# Patient Record
Sex: Male | Born: 1995 | Hispanic: Yes | Marital: Married | Smoking: Never smoker
Health system: Southern US, Community
[De-identification: ages and names within clinical notes are randomized; demographics above are authoritative.]

## PROBLEM LIST (undated history)

## (undated) DIAGNOSIS — J45909 Unspecified asthma, uncomplicated: Secondary | ICD-10-CM

## (undated) HISTORY — DX: Unspecified asthma, uncomplicated: J45.909

---

## 2014-07-07 ENCOUNTER — Emergency Department (HOSPITAL_COMMUNITY)
Admission: EM | Admit: 2014-07-07 | Discharge: 2014-07-07 | Disposition: A | Discharge status: Home / Self Care | Payer: BC Managed Care – PPO | Attending: Emergency Medicine | Admitting: Emergency Medicine

## 2014-07-07 ENCOUNTER — Emergency Department (HOSPITAL_COMMUNITY): Payer: BC Managed Care – PPO

## 2014-07-07 ENCOUNTER — Encounter (HOSPITAL_COMMUNITY): Payer: Self-pay | Admitting: Emergency Medicine

## 2014-07-07 DIAGNOSIS — S6980XA Other specified injuries of unspecified wrist, hand and finger(s), initial encounter: Secondary | ICD-10-CM | POA: Insufficient documentation

## 2014-07-07 DIAGNOSIS — Y9389 Activity, other specified: Secondary | ICD-10-CM | POA: Diagnosis not present

## 2014-07-07 DIAGNOSIS — S6000XA Contusion of unspecified finger without damage to nail, initial encounter: Secondary | ICD-10-CM | POA: Diagnosis not present

## 2014-07-07 DIAGNOSIS — S6990XA Unspecified injury of unspecified wrist, hand and finger(s), initial encounter: Secondary | ICD-10-CM | POA: Diagnosis present

## 2014-07-07 DIAGNOSIS — Z23 Encounter for immunization: Secondary | ICD-10-CM | POA: Insufficient documentation

## 2014-07-07 DIAGNOSIS — S6010XA Contusion of unspecified finger with damage to nail, initial encounter: Secondary | ICD-10-CM

## 2014-07-07 DIAGNOSIS — W230XXA Caught, crushed, jammed, or pinched between moving objects, initial encounter: Secondary | ICD-10-CM | POA: Insufficient documentation

## 2014-07-07 DIAGNOSIS — Y9289 Other specified places as the place of occurrence of the external cause: Secondary | ICD-10-CM | POA: Diagnosis not present

## 2014-07-07 MED ORDER — IBUPROFEN 800 MG PO TABS
800.0000 mg | ORAL_TABLET | Freq: Once | ORAL | Status: AC
  Administered 2014-07-07: 800 mg via ORAL
  Filled 2014-07-07: qty 1

## 2014-07-07 MED ORDER — TETANUS-DIPHTH-ACELL PERTUSSIS 5-2.5-18.5 LF-MCG/0.5 IM SUSP
0.5000 mL | Freq: Once | INTRAMUSCULAR | Status: AC
  Administered 2014-07-07: 0.5 mL via INTRAMUSCULAR
  Filled 2014-07-07: qty 0.5

## 2014-07-07 NOTE — ED Provider Notes (Signed)
CSN: [id number]     Arrival date & time 07/07/14  1018 History   First MD Initiated Contact with Patient 07/07/14 1226   This chart was scribed for non-physician practitioner Jinny Sanders, PA-C,  working with Richardean Canal, MD by Gwenevere Abbot, ED scribe. This patient was seen in room WTR9/WTR9 and the patient's care was started at 12:49 PM.    Chief Complaint  Patient presents with  . Finger Injury   The history is provided by the patient. No language interpreter was used.   HPI Comments:  Thomas Parsons is a 18 y.o. male who presents to the Emergency Department complaining of a finger injury after jamming his fingers in a door, onset 2 days ago. Pt reports that the pain has gradually worsened over the last 2 days. Pt reports that he has taken aspirin for symptoms, with mild relief.   History reviewed. No pertinent past medical history. History reviewed. No pertinent past surgical history. No family history on file. History  Substance Use Topics  . Smoking status: Never Smoker   . Smokeless tobacco: Not on file  . Alcohol Use: No    Review of Systems  Constitutional: Negative for fever and chills.  Musculoskeletal: Positive for arthralgias and myalgias.  Skin: Positive for wound.    Allergies  Review of patient's allergies indicates no known allergies.  Home Medications   Prior to Admission medications   Not on File   BP 133/81  Pulse 71  Temp(Src) 98.1 F (36.7 C) (Oral)  Resp 16  Ht  (1.651 m)  Wt 136 lb 8 oz (61.916 kg)  BMI 22.71 kg/m2  SpO2 95% Physical Exam  Nursing note and vitals reviewed. Constitutional: He is oriented to person, place, and time. He appears well-developed and well-nourished.  HENT:  Head: Normocephalic and atraumatic.  Eyes: EOM are normal.  Neck: Normal range of motion. Neck supple.  Cardiovascular: Normal rate.   Pulmonary/Chest: Effort normal.  Musculoskeletal: Normal range of motion.  Neurological: He is alert and  oriented to person, place, and time.  Skin: Skin is warm and dry.  Subungual hemotoma of the 4th distal phallanx of the right hand with moderate amount of swelling to the distal finger.  Nail matrix intact distally, with nail body well attached to lunula. Patient also has mild ecchymosis to third distal phalanx without subungual hematoma. Distal sensation intact. Capillary refill distally less than 2 seconds. Radial pulses 2+ bilaterally. Patient has full active range of motion of fingers, with strength 5 out of 5.  Psychiatric: He has a normal mood and affect. His behavior is normal.    ED Course  INCISION AND DRAINAGE Date/Time: 07/07/2014 9:34 PM Performed by: Monte Fantasia Authorized by: Monte Fantasia Consent: Verbal consent obtained. Consent given by: patient Type: subungual hematoma Body area: upper extremity Location details: right ring finger Drainage: serosanguinous Patient tolerance: Patient tolerated the procedure well with no immediate complications. Comments: Nail trephination    DIAGNOSTIC STUDIES: Oxygen Saturation is 95% on RA, adequate by my interpretation.  COORDINATION OF CARE: 12:49 PM-Discussed treatment plan which includes cauterization to the fingers with pt at bedside and pt agreed to plan.  Labs Review Labs Reviewed - No data to display  Imaging Review Dg Finger Middle Right  07/07/2014   CLINICAL DATA:  Slammed fingers in door, pain and bruising  EXAM: RIGHT MIDDLE FINGER 2+V  COMPARISON:  None  FINDINGS: Osseous mineralization normal.  Joint spaces preserved.  No fracture,  dislocation, or bone destruction.  IMPRESSION: Normal exam.   Electronically Signed   By: Ulyses Southward M.D.   On: 07/07/2014 12:46   Dg Finger Ring Right  07/07/2014   CLINICAL DATA:  Trauma.  EXAM: RIGHT RING FINGER 2+V  COMPARISON:  None.  FINDINGS: There is no evidence of fracture or dislocation. There is no evidence of arthropathy or other focal bone abnormality. Soft tissues are  unremarkable.  IMPRESSION: Negative.   Electronically Signed   By: Maisie Fus  Register   On: 07/07/2014 12:47     EKG Interpretation None      MDM   Final diagnoses:  Subungual hematoma, fingernail, initial encounter    Patient with subungual hematoma of the nail of the ring finger of right hand. Nail matrix intact proximally and distally with hematoma noted to majority of fingernail. Distal sensation intact with neuro exam benign the Nail body well intact with lunula. Hematoma amenable to nail trephination with electrocautery. Radiographs are unremarkable for any acute osseous abnormality. Patient expresses feeling mild relief after trephination, and mild amount of serous fluid draining from nail. Patient encouraged to return to the ER or return to his primary care physician in 2 days for wound reevaluation. I discussed return precautions with patient, and patient was agreeable to this plan.  BP 134/70  Pulse 66  Temp(Src) 98.2 F (36.8 C) (Oral)  Resp 16  Ht  (1.651 m)  Wt 136 lb 8 oz (61.916 kg)  BMI 22.71 kg/m2  SpO2 100%  Signed,  Ladona Mow, PA-C 9:43 PM  I personally performed the services described in this documentation, which was scribed in my presence. The recorded information has been reviewed and is accurate.     Monte Fantasia, PA-C 07/07/14 2143

## 2014-07-07 NOTE — ED Notes (Signed)
Pt states that he accidentally shut his right middle and ring finger in the door on Friday. Ringer finger nail is black and purplish in color. Pt is bale to move fingers.

## 2014-07-07 NOTE — ED Notes (Signed)
Family at bedside. 

## 2014-07-07 NOTE — Discharge Instructions (Signed)
Followup with clinic at [facility] or return to the ER in 2 days for wound reassessment. Return to the ER if he developed any numbness, tingling, loss of function, purulent discharge, fever.  Subungual Hematoma A subungual hematoma is a pocket of blood that collects under the fingernail or toenail. The pressure created by the blood under the nail can cause pain. CAUSES  A subungual hematoma occurs when an injury to the finger or toe causes a blood vessel beneath the nail to break. The injury can occur from a direct blow such as slamming a finger in a door. It can also occur from a repeated injury such as pressure on the foot in a shoe while running. A subungual hematoma is sometimes called runner's toe or tennis toe. SYMPTOMS   Blue or dark blue skin under the nail.  Pain or throbbing in the injured area. DIAGNOSIS  Your caregiver can determine whether you have a subungual hematoma based on your history and a physical exam. If your caregiver thinks you might have a broken (fractured) bone, X-rays may be taken. TREATMENT  Hematomas usually go away on their own over time. Your caregiver may make a hole in the nail to drain the blood. Draining the blood is painless and usually provides significant relief from pain and throbbing. The nail usually grows back normally after this procedure. In some cases, the nail may need to be removed. This is done if there is a cut under the nail that requires stitches (sutures). HOME CARE INSTRUCTIONS   Put ice on the injured area.  Put ice in a plastic bag.  Place a towel between your skin and the bag.  Leave the ice on for 15-20 minutes, 03-04 times a day for the first 1 to 2 days.  Elevate the injured area to help decrease pain and swelling.  If you were given a bandage, wear it for as long as directed by your caregiver.  If part of your nail falls off, trim the remaining nail gently. This prevents the nail from catching on something and causing further  injury.  Only take over-the-counter or prescription medicines for pain, discomfort, or fever as directed by your caregiver. SEEK IMMEDIATE MEDICAL CARE IF:   You have redness or swelling around the nail.  You have yellowish-white fluid (pus) coming from the nail.  Your pain is not controlled with medicine.  You have a fever. MAKE SURE YOU:  Understand these instructions.  Will watch your condition.  Will get help right away if you are not doing well or get worse. Document Released: 09/24/2000 Document Revised: 12/20/2011 Document Reviewed: 09/15/2011 [facility] Patient Information 2015 [place], [place]. This information is not intended to replace advice given to you by your health care provider. Make sure you discuss any questions you have with your health care provider.

## 2014-07-09 NOTE — ED Provider Notes (Signed)
Medical screening examination/treatment/procedure(s) were performed by non-physician practitioner and as supervising physician I was immediately available for consultation/collaboration.   EKG Interpretation None        David H Yao, MD 07/09/14 1523 

## 2015-09-03 IMAGING — CR DG FINGER MIDDLE 2+V*R*
1 series · 1 of 1 positions shown · non-contrast
Comparison: None

CLINICAL DATA: Slammed fingers in door, pain and bruising

EXAM:
RIGHT MIDDLE FINGER 2+V

[x finger lat right]
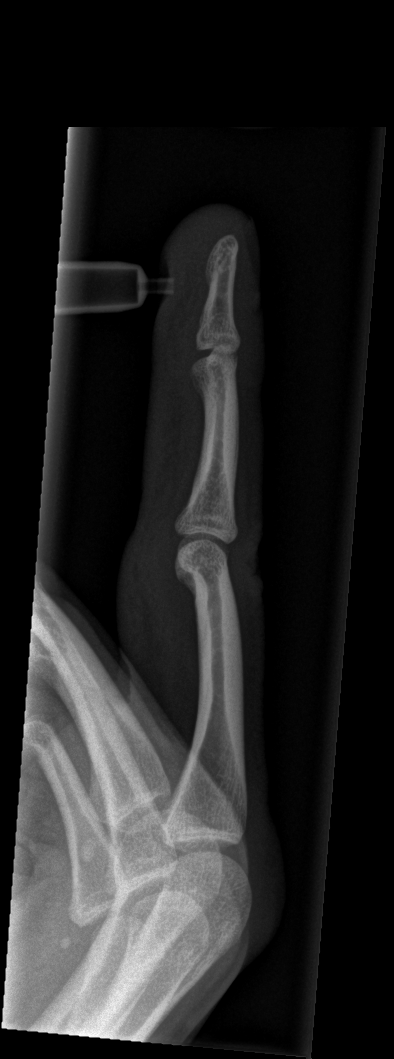

[1 of 1 positions shown; findings below may reference images not displayed]

FINDINGS: Osseous mineralization normal.

Joint spaces preserved.

No fracture, dislocation, or bone destruction.
IMPRESSION: Normal exam.

## 2016-11-30 ENCOUNTER — Other Ambulatory Visit: Payer: Self-pay | Admitting: Physician Assistant

## 2016-11-30 DIAGNOSIS — N5089 Other specified disorders of the male genital organs: Secondary | ICD-10-CM

## 2016-12-03 ENCOUNTER — Ambulatory Visit
Admission: RE | Admit: 2016-12-03 | Discharge: 2016-12-03 | Disposition: A | Discharge status: Home / Self Care | Payer: BLUE CROSS/BLUE SHIELD | Source: Ambulatory Visit | Attending: Physician Assistant | Admitting: Physician Assistant

## 2016-12-03 DIAGNOSIS — N5089 Other specified disorders of the male genital organs: Secondary | ICD-10-CM

## 2017-08-03 ENCOUNTER — Emergency Department (HOSPITAL_COMMUNITY)
Admission: EM | Admit: 2017-08-03 | Discharge: 2017-08-03 | Discharge status: Against Medical Advice | Payer: BLUE CROSS/BLUE SHIELD | Attending: Emergency Medicine | Admitting: Emergency Medicine

## 2017-08-03 ENCOUNTER — Encounter (HOSPITAL_COMMUNITY): Payer: Self-pay

## 2017-08-03 DIAGNOSIS — Z5321 Procedure and treatment not carried out due to patient leaving prior to being seen by health care provider: Secondary | ICD-10-CM | POA: Diagnosis not present

## 2017-08-03 NOTE — ED Triage Notes (Signed)
Called for pt 2 more times no answer.
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
## 2017-08-03 NOTE — ED Triage Notes (Signed)
Called for pt in ED waiting area x 2 no answer.

## 2019-09-30 ENCOUNTER — Ambulatory Visit (INDEPENDENT_AMBULATORY_CARE_PROVIDER_SITE_OTHER): Payer: BC Managed Care – PPO

## 2019-09-30 ENCOUNTER — Encounter (HOSPITAL_COMMUNITY): Payer: Self-pay

## 2019-09-30 ENCOUNTER — Ambulatory Visit (HOSPITAL_COMMUNITY)
Admission: EM | Admit: 2019-09-30 | Discharge: 2019-09-30 | Disposition: A | Discharge status: Home / Self Care | Payer: BC Managed Care – PPO | Attending: Family Medicine | Admitting: Family Medicine

## 2019-09-30 DIAGNOSIS — S63502A Unspecified sprain of left wrist, initial encounter: Secondary | ICD-10-CM

## 2019-09-30 NOTE — ED Provider Notes (Signed)
[facility]    CSN: [id number] Arrival date & time: 09/30/19  1524      History   Chief Complaint Chief Complaint  Patient presents with  . Wrist Pain    HPI Nicolae Vasek is a 23 y.o. male.   HPI  Patient went snowboarding 2 weeks ago.  He fell and injured his left wrist.  He states that he fell on his outstretched hands.  His wrist has been painful ever since then.  He states initially it was swollen.  It was never discolored.  It continues to hurt when he does any heavy lifting or activity. No prior wrist problems History reviewed. No pertinent past medical history.  There are no problems to display for this patient.   History reviewed. No pertinent surgical history.     Home Medications    Prior to Admission medications   Not on File    Family History History reviewed. No pertinent family history.  Social History Social History   Tobacco Use  . Smoking status: Never Smoker  . Smokeless tobacco: Never Used  Substance Use Topics  . Alcohol use: No  . Drug use: No     Allergies   Patient has no known allergies.   Review of Systems Review of Systems  Constitutional: Negative for chills and fever.  HENT: Negative for congestion and hearing loss.   Eyes: Negative for pain.  Respiratory: Negative for cough and shortness of breath.   Cardiovascular: Negative for chest pain and leg swelling.  Gastrointestinal: Negative for abdominal pain, constipation and diarrhea.  Genitourinary: Negative for dysuria and frequency.  Musculoskeletal: Positive for arthralgias. Negative for myalgias.  Neurological: Negative for dizziness, seizures and headaches.  Psychiatric/Behavioral: The patient is not nervous/anxious.      Physical Exam Triage Vital Signs ED Triage Vitals  Enc Vitals Group     BP 09/30/19 1543 137/74     Pulse Rate 09/30/19 1543 73     Resp 09/30/19 1543 16     Temp 09/30/19 1543 98.4 F (36.9 C)     Temp Source  09/30/19 1543 Oral     SpO2 09/30/19 1543 100 %     Weight --      Height --      Head Circumference --      Peak Flow --      Pain Score 09/30/19 1542 3     Pain Loc --      Pain Edu? --      Excl. in GC? --    No data found.  Updated Vital Signs BP 137/74 (BP Location: Right Arm)   Pulse 73   Temp 98.4 F (36.9 C) (Oral)   Resp 16   SpO2 100%   Visual Acuity Right Eye Distance:   Left Eye Distance:   Bilateral Distance:    Right Eye Near:   Left Eye Near:    Bilateral Near:     Physical Exam Constitutional:      General: He is not in acute distress.    Appearance: He is well-developed.  HENT:     Head: Normocephalic and atraumatic.  Eyes:     Conjunctiva/sclera: Conjunctivae normal.     Pupils: Pupils are equal, round, and reactive to light.  Cardiovascular:     Rate and Rhythm: Normal rate.  Pulmonary:     Effort: Pulmonary effort is normal. No respiratory distress.  Abdominal:     General: There is no distension.  Palpations: Abdomen is soft.  Musculoskeletal:        General: Normal range of motion.     Cervical back: Normal range of motion.     Comments: Left wrist has minimal soft tissue swelling.  Full range of motion.  Tenderness over the ulnar styloid.  Mild tenderness over the scaphoid.  Good grip strength.  Normal sensory exam  Skin:    General: Skin is warm and dry.  Neurological:     Mental Status: He is alert.      UC Treatments / Results  Labs (all labs ordered are listed, but only abnormal results are displayed) Labs Reviewed - No data to display  EKG   Radiology DG Wrist Complete Left  Result Date: 09/30/2019 CLINICAL DATA:  Acute LEFT wrist pain following fall 2 weeks ago. Initial encounter. EXAM: LEFT WRIST - COMPLETE 3+ VIEW COMPARISON:  None. FINDINGS: There is no evidence of fracture or dislocation. There is no evidence of arthropathy or other focal bone abnormality. Soft tissues are unremarkable. IMPRESSION: Negative.  Electronically Signed   By: Margarette Canada M.D.   On: 09/30/2019 16:17    Procedures Procedures (including critical care time)  Medications Ordered in UC Medications - No data to display  Initial Impression / Assessment and Plan / UC Course  I have reviewed the triage vital signs and the nursing notes.  Pertinent labs & imaging results that were available during my care of the patient were reviewed by me and considered in my medical decision making (see chart for details).     Healing from wrist sprain takes 3 to 6-week.  Discussed with patient Final Clinical Impressions(s) / UC Diagnoses   Final diagnoses:  Sprain of left wrist, initial encounter     Discharge Instructions     X ray normal Wrist is sprained Expect improvement in 1-2 weeks   ED Prescriptions    None     PDMP not reviewed this encounter.   Raylene Everts, MD 09/30/19 [phone]

## 2019-09-30 NOTE — ED Triage Notes (Signed)
Pt present left wrist pain from a fall two weeks ago. Pt states that when he carry item or upon waking him wrist sometimes be stiff and very painful to move.

## 2019-09-30 NOTE — Discharge Instructions (Signed)
X ray normal Wrist is sprained Expect improvement in 1-2 weeks

## 2023-05-10 ENCOUNTER — Other Ambulatory Visit: Payer: Self-pay

## 2023-05-10 ENCOUNTER — Ambulatory Visit (INDEPENDENT_AMBULATORY_CARE_PROVIDER_SITE_OTHER): Payer: No Typology Code available for payment source | Admitting: Internal Medicine

## 2023-05-10 ENCOUNTER — Encounter: Payer: Self-pay | Admitting: Internal Medicine

## 2023-05-10 VITALS — BP 118/74 | HR 66 | Temp 98.5°F | Resp 16 | Ht 65.35 in | Wt 141.4 lb

## 2023-05-10 DIAGNOSIS — J3081 Allergic rhinitis due to animal (cat) (dog) hair and dander: Secondary | ICD-10-CM | POA: Diagnosis not present

## 2023-05-10 DIAGNOSIS — J4530 Mild persistent asthma, uncomplicated: Secondary | ICD-10-CM

## 2023-05-10 DIAGNOSIS — J301 Allergic rhinitis due to pollen: Secondary | ICD-10-CM

## 2023-05-10 DIAGNOSIS — J3089 Other allergic rhinitis: Secondary | ICD-10-CM

## 2023-05-10 DIAGNOSIS — H16261 Vernal keratoconjunctivitis, with limbar and corneal involvement, right eye: Secondary | ICD-10-CM

## 2023-05-10 MED ORDER — AZELASTINE HCL 0.1 % NA SOLN: 1.0000 | Freq: Two times a day (BID) | NASAL | 5 refills | Status: DC | PRN

## 2023-05-10 MED ORDER — FLUTICASONE PROPIONATE 50 MCG/ACT NA SUSP: 2.0000 | Freq: Every day | NASAL | 5 refills | Status: DC

## 2023-05-10 MED ORDER — ALBUTEROL SULFATE HFA 108 (90 BASE) MCG/ACT IN AERS: 2.0000 | INHALATION_SPRAY | Freq: Four times a day (QID) | RESPIRATORY_TRACT | 1 refills | Status: DC | PRN

## 2023-05-10 MED ORDER — MONTELUKAST SODIUM 10 MG PO TABS: 10.0000 mg | ORAL_TABLET | Freq: Every day | ORAL | 5 refills | Status: DC

## 2023-05-10 MED ORDER — CETIRIZINE HCL 10 MG PO TABS: 10.0000 mg | ORAL_TABLET | Freq: Every day | ORAL | 5 refills | Status: DC

## 2023-05-10 MED ORDER — FLUTICASONE PROPIONATE HFA 110 MCG/ACT IN AERO: 2.0000 | INHALATION_SPRAY | Freq: Two times a day (BID) | RESPIRATORY_TRACT | 5 refills | Status: DC

## 2023-05-10 NOTE — Progress Notes (Signed)
NEW PATIENT  Date of Service/Encounter:  05/10/23  Consult requested by: Patient, No Pcp Per   Subjective:   Thomas Parsons (DOB: Jun 01, 1996) is a 27 y.o. male who presents to the clinic on 05/10/2023 with a chief complaint of Asthma and Seasonal Allergies .    History obtained from: chart review and patient.   Asthma:  Mostly had it in puberty. In 2021, had a viral illness and after which he started having frequent episodes of chest tightness and SOB so was started on Flovent.  Since then, he has done much better.   rarely daytime symptoms in past month, none nighttime awakenings in past month Using rescue inhaler: rarely; last use was 3 months ago  Limitations to daily activity: none 0 ED visits/UC visits and 0 oral steroids in the past year 0 number of lifetime hospitalizations, 0 number of lifetime intubations.  Identified Triggers: exercise and respiratory illness Prior PFTs or spirometry: none available for review  Previously used therapies: none Current regimen:  Maintenance: Flovent 2 puffs daily  Rescue: Albuterol 2 puffs q4-6 hrs PRN  Rhinitis:  Started around age 66.  Symptoms include: nasal congestion, rhinorrhea, post nasal drainage, and sneezing  He has VKC R eye and it can cause redness, itching, dryness, discharge.  He sees Dr. Cresenciano Genre at [facility] care and was officially diagnosed in 2022.   Letter from Dr. 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   Occurs year-round Potential triggers: not sure  Treatments tried:  Cromolyn and Verkazia  Prednisone eye drops on and off Zyrtec daily; last use was Friday No nose sprays  Previous allergy testing: no History of reflux/heartburn: none History of sinus surgery: no Nonallergic triggers: none     Past Medical History: Past Medical History:  Diagnosis Date   Asthma     Past Surgical History: History reviewed. No pertinent surgical history.  Family History: Family History  Problem Relation Age of Onset   Asthma Mother    Allergic rhinitis Mother    Allergic rhinitis Father    Allergic rhinitis Maternal Aunt     Social History:  Flooring in bedroom: wood Pets: cat Tobacco use/exposure: none Job: [employer]   Medication List:  Allergies as of 05/10/2023   No Known Allergies      Medication List        Accurate as of May 10, 2023 11:29 AM. If you have any questions, ask your nurse or doctor.          STOP taking these medications    albuterol 108 (90 Base) MCG/ACT inhaler Commonly known as: VENTOLIN HFA Stopped by: Birder Robson       TAKE these medications    cromolyn 4 % ophthalmic solution Commonly known as: OPTICROM Place 1 drop into both eyes 4 (four) times daily.   Verkazia 0.1 % Emul Generic drug: cycloSPORINE Place 1 drop into the right eye 2 (two) times daily.         REVIEW OF SYSTEMS: Pertinent positives and negatives discussed in HPI.   Objective:   Physical Exam: BP 118/74   Pulse 66   Temp 98.5 F (36.9 C) (Temporal)   Resp 16   Ht 5' 5.35" (1.66 m)   Wt 141 lb 6.4 oz (64.1 kg)   SpO2 97%   BMI 23.28 kg/m  Body mass index is 23.28 kg/m. GEN: alert, well developed HEENT: R eye with slight conjunctival injection and slight drooping of eyelid, TM grey and translucent, nose with + inferior turbinate hypertrophy, boggy nasal mucosa, lots of clear rhinorrhea, slight cobblestoning HEART: regular rate and rhythm, no murmur LUNGS: clear to auscultation bilaterally, no coughing, unlabored respiration ABDOMEN: soft, non distended  SKIN: no rashes or lesions  Reviewed:  Letter from Dr. 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   02/06/2022: seen in urgent care clinic with sore throat, COVID negative at home,discussed acute pharyngitis.  11/28/2020: seen in urgent care for allergy and asthma, noticed wheezing, R eye itchy and watery. Noted to have wheezing on exam. Started on medrol dosepak, albuterol PRN, Singulair daily, Flonase and naphazoline-pheniramine eye drops.   08/08/2020: seen in urgent care for intermittent SOB, productive cough. Normal lung exam. Negative CXR. Discussed bacterial URI, started on Augmentin, Albuterol PRN, tessalon perles.   Spirometry:  Tracings reviewed. His effort: Good reproducible efforts. FVC: 4.2L FEV1: 3.51L, 91% predicted FEV1/FVC ratio: 84% Interpretation: Spirometry consistent with normal pattern.  Please see scanned spirometry results for details.  Skin Testing:  Skin prick testing was placed, which includes aeroallergens/foods, histamine control, and saline control.  Verbal consent was obtained prior to placing test.  Patient tolerated procedure well.  Allergy testing results were read and interpreted by myself, documented by clinical staff. Adequate positive and negative control.  Results discussed with patient/family.  Airborne Adult Perc - 05/10/23 0900     Time Antigen Placed 0900    Allergen Manufacturer Waynette Buttery    Location Back    Number of Test 55    1. Control-Buffer 50% Glycerol Negative    2. Control-Histamine 3+    3. Bahia Negative    4. French Southern Territories Negative    5. Johnson Negative    6. Kentucky Blue 3+    7. Meadow Fescue 3+    8. Perennial Rye 3+    9. Timothy 3+    10. Ragweed Mix 3+    11. Cocklebur 2+    12. Plantain,  English 3+    13. Baccharis 2+    14. Dog Fennel Negative    15. Russian Thistle 3+    16. Lamb's Quarters 3+    17. Sheep Sorrell 3+    18. Rough Pigweed 3+    19. Marsh Elder, Rough 2+    20. Mugwort, Common Negative     21. Box, Elder 3+    22. Cedar, red 3+    23. Sweet Gum Negative    24. Pecan Pollen Negative    25. Pine Mix Negative    26. Walnut, Black Pollen Negative    27. Red Mulberry 3+    28. Ash Mix 3+    29. Birch Mix 3+    30. Beech American Negative    31. Cottonwood, Guinea-Bissau 2+    32. Hickory, White 3+    33. Maple Mix Negative    34. Oak, Guinea-Bissau Mix 3+    35. Sycamore Eastern 3+    36. Alternaria Alternata Negative    37. Cladosporium Herbarum Negative    38. Aspergillus Mix 3+    39. Penicillium Mix 2+    40. Bipolaris Sorokiniana (Helminthosporium) 2+    41. Drechslera Spicifera (Curvularia) 3+    42. Mucor  Plumbeus Negative    43. Fusarium Moniliforme Negative    44. Aureobasidium Pullulans (pullulara) Negative    45. Rhizopus Oryzae Negative    46. Botrytis Cinera Negative    47. Epicoccum Nigrum Negative    48. Phoma Betae 3+    49. Dust Mite Mix 3+    50. Cat Hair 10,000 BAU/ml 3+    51.  Dog Epithelia 3+    52. Mixed Feathers Negative    53. Horse Epithelia 3+    54. Cockroach, German Negative    55. Tobacco Leaf Negative             Intradermal - 05/10/23 0932     Time Antigen Placed 0932    Allergen Manufacturer Waynette Buttery    Location Arm    Number of Test 6    Intradermal Select    Control Negative    Bahia Negative    French Southern Territories Negative    Johnson 3+    7 Grass Omitted    Ragweed Mix Omitted    Weed Mix Omitted    Tree Mix Omitted    Mold 1 3+    Mold 2 Omitted    Mold 3 Omitted    Mold 4 3+    Mite Mix Omitted    Cat Omitted    Dog Omitted    Cockroach Negative    Other Omitted               Assessment:   1. Mild persistent asthma without complication   2. Vernal keratoconjunctivitis of right eye   3. Seasonal allergic rhinitis due to pollen   4. Allergic rhinitis caused by mold   5. Allergic rhinitis due to dust mite   6. Allergic rhinitis due to animal hair or dander     Plan/Recommendations:  Allergic Rhinitis: Vernal  Keratoconjunctivitis: - Due to turbinate hypertrophy, uncontrolled vernal keratoconjunctivitis despite topical steroids/cyclosporine and unresponsive to other over the counter meds, performed skin testing to identify aeroallergen triggers.   - Positive skin test 04/2023: trees, grasses, weeds, molds, dust mite, cats, dogs  - Avoidance measures discussed. - Use nasal saline rinses before nose sprays such as with Neilmed Sinus Rinse.  Use distilled water.   - Use Flonase 2 sprays each nostril daily. Aim upward and outward. - Use Azelastine 1-2 sprays each nostril twice daily as needed for runny nose, drainage, sneezing, congestion. Aim upward and outward. - Use Zyrtec 10 mg daily.  - Use Singulair 10mg  daily. Stop if there are any mood/behavioral changes. - Follow up with Ophthalmology closely for eye drop management: currently on Cromolyn, Verkazia, steroid eye drops.  - I highly recommend you start allergy shots as long term control of your symptoms by teaching your immune system to be more tolerant of your allergy triggers.  This will improve both your sinus and eye symptoms.  Please call us back if interested in starting after discussing coverage with insurance company.   Mild Persistent Asthma: - MDI technique discussed.  Spirometry today was normal.  - Maintenance inhaler: continue Flovent 2 puffs daily.  If symptoms worsen, increase to 2 puffs twice daily.   - Rescue inhaler: Albuterol 2 puffs via spacer or 1 vial via nebulizer every 4-6 hours as needed for respiratory symptoms of cough, shortness of breath, or wheezing Asthma control goals:  Full participation in all desired activities (may need albuterol before activity) Albuterol use two times or less a week on average (not counting use with  activity) Cough interfering with sleep two times or less a month Oral steroids no more than once a year No hospitalizations   Return in about 6 weeks (around 06/21/2023).  Alesia Morin,  MD [facility]

## 2023-05-10 NOTE — Patient Instructions (Addendum)
Allergic Rhinitis: Vernal Keratoconjunctivitis: - Positive skin test 04/2023: trees, grasses, weeds, molds, dust mite, cats, dogs  - Avoidance measures discussed. - Use nasal saline rinses before nose sprays such as with Neilmed Sinus Rinse.  Use distilled water.   - Use Flonase 2 sprays each nostril daily. Aim upward and outward. - Use Azelastine 1-2 sprays each nostril twice daily as needed for runny nose, drainage, sneezing, congestion. Aim upward and outward. - Use Zyrtec 10 mg daily.  - Use Singulair 10mg  daily. Stop if there are any mood/behavioral changes. - Follow up with Ophthalmology closely for eye drop management: currently on Cromolyn, Verkazia, steroid eye drops.  - I highly recommend you start allergy shots as long term control of your symptoms by teaching your immune system to be more tolerant of your allergy triggers.  This will improve both your sinus and eye symptoms.  Please call us back and let us know if you are interested in starting these.  Mild Persistent Asthma: - MDI technique discussed.  Spirometry today was normal.  - Maintenance inhaler: continue Flovent 2 puffs daily.  If symptoms worsen, increase to 2 puffs twice daily.   - Rescue inhaler: Albuterol 2 puffs via spacer or 1 vial via nebulizer every 4-6 hours as needed for respiratory symptoms of cough, shortness of breath, or wheezing Asthma control goals:  Full participation in all desired activities (may need albuterol before activity) Albuterol use two times or less a week on average (not counting use with activity) Cough interfering with sleep two times or less a month Oral steroids no more than once a year No hospitalizations   ALLERGEN AVOIDANCE MEASURES   Dust Mites Use central air conditioning and heat; and change the filter monthly.  Pleated filters work better than mesh filters.  Electrostatic filters may also be used; wash the filter monthly.  Window air conditioners may be used, but do not  clean the air as well as a central air conditioner.  Change or wash the filter monthly. Keep windows closed.  Do not use attic fans.   Encase the mattress, box springs and pillows with zippered, dust proof covers. Wash the bed linens in hot water weekly.   Remove carpet, especially from the bedroom. Remove stuffed animals, throw pillows, dust ruffles, heavy drapes and other items that collect dust from the bedroom. Do not use a humidifier.   Use wood, vinyl or leather furniture instead of cloth furniture in the bedroom. Keep the indoor humidity at 30 - 40%.  Monitor with a humidity gauge.  Molds - Indoor avoidance Use air conditioning to reduce indoor humidity.  Do not use a humidifier. Keep indoor humidity at 30 - 40%.  Use a dehumidifier if needed. In the bathroom use an exhaust fan or open a window after showering.  Wipe down damp surfaces after showering.  Clean bathrooms with a mold-killing solution (diluted bleach, or products like Tilex, etc) at least once a month. In the kitchen use an exhaust fan to remove steam from cooking.  Throw away spoiled foods immediately, and empty garbage daily.  Empty water pans below self-defrosting refrigerators frequently. Vent the clothes dryer to the outside. Limit indoor houseplants; mold grows in the dirt.  No houseplants in the bedroom. Remove carpet from the bedroom. Encase the mattress and box springs with a zippered encasing.  Molds - Outdoor avoidance Avoid being outside when the grass is being mowed, or the ground is tilled. Avoid playing in leaves, pine straw, hay, etc.  Dead plant materials contain mold. Avoid going into barns or grain storage areas. Remove leaves, clippings and compost from around the home. Pollen Avoidance Pollen levels are highest during the mid-day and afternoon.  Consider this when planning outdoor activities. Avoid being outside when the grass is being mowed, or wear a mask if the pollen-allergic person must be the  one to mow the grass. Keep the windows closed to keep pollen outside of the home. Use an air conditioner to filter the air. Take a shower, wash hair, and change clothing after working or playing outdoors during pollen season. Pet Dander Keep the pet out of your bedroom and restrict it to only a few rooms. Be advised that keeping the pet in only one room will not limit the allergens to that room. Don't pet, hug or kiss the pet; if you do, wash your hands with soap and water. High-efficiency particulate air (HEPA) cleaners run continuously in a bedroom or living room can reduce allergen levels over time. Regular use of a high-efficiency vacuum cleaner or a central vacuum can reduce allergen levels. Giving your pet a bath at least once a week can reduce airborne allergen.    ALLERGEN AVOIDANCE MEASURES   Dust Mites Use central air conditioning and heat; and change the filter monthly.  Pleated filters work better than mesh filters.  Electrostatic filters may also be used; wash the filter monthly.  Window air conditioners may be used, but do not clean the air as well as a central air conditioner.  Change or wash the filter monthly. Keep windows closed.  Do not use attic fans.   Encase the mattress, box springs and pillows with zippered, dust proof covers. Wash the bed linens in hot water weekly.   Remove carpet, especially from the bedroom. Remove stuffed animals, throw pillows, dust ruffles, heavy drapes and other items that collect dust from the bedroom. Do not use a humidifier.   Use wood, vinyl or leather furniture instead of cloth furniture in the bedroom. Keep the indoor humidity at 30 - 40%.  Monitor with a humidity gauge.  Molds - Indoor avoidance Use air conditioning to reduce indoor humidity.  Do not use a humidifier. Keep indoor humidity at 30 - 40%.  Use a dehumidifier if needed. In the bathroom use an exhaust fan or open a window after showering.  Wipe down damp surfaces after  showering.  Clean bathrooms with a mold-killing solution (diluted bleach, or products like Tilex, etc) at least once a month. In the kitchen use an exhaust fan to remove steam from cooking.  Throw away spoiled foods immediately, and empty garbage daily.  Empty water pans below self-defrosting refrigerators frequently. Vent the clothes dryer to the outside. Limit indoor houseplants; mold grows in the dirt.  No houseplants in the bedroom. Remove carpet from the bedroom. Encase the mattress and box springs with a zippered encasing.  Molds - Outdoor avoidance Avoid being outside when the grass is being mowed, or the ground is tilled. Avoid playing in leaves, pine straw, hay, etc.  Dead plant materials contain mold. Avoid going into barns or grain storage areas. Remove leaves, clippings and compost from around the home.  Cockroach Limit spread of food around the house; especially keep food out of bedrooms. Keep food and garbage in closed containers with a tight lid.  Never leave food out in the kitchen.  Do not leave out pet food or dirty food bowls. Mop the kitchen floor and wash countertops at least  once a week. Repair leaky pipes and faucets so there is no standing water to attract roaches. Plug up cracks in the house through which cockroaches can enter. Use bait stations and approved pesticides to reduce cockroach infestation. Pollen Avoidance Pollen levels are highest during the mid-day and afternoon.  Consider this when planning outdoor activities. Avoid being outside when the grass is being mowed, or wear a mask if the pollen-allergic person must be the one to mow the grass. Keep the windows closed to keep pollen outside of the home. Use an air conditioner to filter the air. Take a shower, wash hair, and change clothing after working or playing outdoors during pollen season. Pet Dander Keep the pet out of your bedroom and restrict it to only a few rooms. Be advised that keeping the pet  in only one room will not limit the allergens to that room. Don't pet, hug or kiss the pet; if you do, wash your hands with soap and water. High-efficiency particulate air (HEPA) cleaners run continuously in a bedroom or living room can reduce allergen levels over time. Regular use of a high-efficiency vacuum cleaner or a central vacuum can reduce allergen levels. Giving your pet a bath at least once a week can reduce airborne allergen.

## 2023-05-10 NOTE — Addendum Note (Signed)
Addended by: Kellie Simmering, Alek Poncedeleon on: 05/10/2023 11:57 AM   Modules accepted: Orders

## 2023-05-12 ENCOUNTER — Telehealth: Payer: Self-pay | Admitting: Internal Medicine

## 2023-05-12 MED ORDER — MONTELUKAST SODIUM 10 MG PO TABS: 10.0000 mg | ORAL_TABLET | Freq: Every day | ORAL | 5 refills | Status: DC

## 2023-05-12 MED ORDER — CETIRIZINE HCL 10 MG PO TABS: 10.0000 mg | ORAL_TABLET | Freq: Every day | ORAL | 5 refills | Status: DC

## 2023-05-12 MED ORDER — ALBUTEROL SULFATE HFA 108 (90 BASE) MCG/ACT IN AERS: 2.0000 | INHALATION_SPRAY | Freq: Four times a day (QID) | RESPIRATORY_TRACT | 1 refills | Status: DC | PRN

## 2023-05-12 MED ORDER — FLUTICASONE PROPIONATE 50 MCG/ACT NA SUSP: 2.0000 | Freq: Every day | NASAL | 5 refills | Status: DC

## 2023-05-12 MED ORDER — FLUTICASONE PROPIONATE HFA 110 MCG/ACT IN AERO: 2.0000 | INHALATION_SPRAY | Freq: Two times a day (BID) | RESPIRATORY_TRACT | 5 refills | Status: DC

## 2023-05-12 MED ORDER — CROMOLYN SODIUM 4 % OP SOLN: 1.0000 [drp] | Freq: Four times a day (QID) | OPHTHALMIC | 5 refills | Status: DC | PRN

## 2023-05-12 MED ORDER — AZELASTINE HCL 0.1 % NA SOLN: 1.0000 | Freq: Two times a day (BID) | NASAL | 5 refills | Status: DC | PRN

## 2023-05-12 NOTE — Telephone Encounter (Signed)
Patient called stating his medication was never sent to the his pharmacy. Patient's pharmacy is incorrect in his chart. Patient's pharmacy is [facility] [address] instead of [facility] on [address].

## 2023-05-12 NOTE — Telephone Encounter (Signed)
Medications have been sent in. Patient has been notified.

## 2023-06-24 ENCOUNTER — Other Ambulatory Visit: Payer: Self-pay

## 2023-06-24 ENCOUNTER — Ambulatory Visit (INDEPENDENT_AMBULATORY_CARE_PROVIDER_SITE_OTHER): Payer: No Typology Code available for payment source | Admitting: Internal Medicine

## 2023-06-24 ENCOUNTER — Encounter: Payer: Self-pay | Admitting: Internal Medicine

## 2023-06-24 VITALS — BP 116/78 | HR 70 | Temp 97.6°F | Resp 16 | Ht 65.0 in | Wt 143.1 lb

## 2023-06-24 DIAGNOSIS — J4530 Mild persistent asthma, uncomplicated: Secondary | ICD-10-CM | POA: Diagnosis not present

## 2023-06-24 DIAGNOSIS — J3089 Other allergic rhinitis: Secondary | ICD-10-CM

## 2023-06-24 DIAGNOSIS — H16263 Vernal keratoconjunctivitis, with limbar and corneal involvement, bilateral: Secondary | ICD-10-CM | POA: Diagnosis not present

## 2023-06-24 DIAGNOSIS — J302 Other seasonal allergic rhinitis: Secondary | ICD-10-CM

## 2023-06-24 MED ORDER — FLUTICASONE PROPIONATE 50 MCG/ACT NA SUSP: 2.0000 | Freq: Every day | NASAL | 5 refills | Status: DC

## 2023-06-24 MED ORDER — ALBUTEROL SULFATE HFA 108 (90 BASE) MCG/ACT IN AERS: 2.0000 | INHALATION_SPRAY | Freq: Four times a day (QID) | RESPIRATORY_TRACT | 1 refills | Status: DC | PRN

## 2023-06-24 MED ORDER — MONTELUKAST SODIUM 10 MG PO TABS: 10.0000 mg | ORAL_TABLET | Freq: Every day | ORAL | 5 refills | Status: DC

## 2023-06-24 MED ORDER — FLUTICASONE PROPIONATE HFA 110 MCG/ACT IN AERO: 2.0000 | INHALATION_SPRAY | Freq: Two times a day (BID) | RESPIRATORY_TRACT | 5 refills | Status: DC

## 2023-06-24 MED ORDER — AZELASTINE HCL 0.1 % NA SOLN: 1.0000 | Freq: Two times a day (BID) | NASAL | 5 refills | Status: DC | PRN

## 2023-06-24 MED ORDER — CETIRIZINE HCL 10 MG PO TABS: 10.0000 mg | ORAL_TABLET | Freq: Every day | ORAL | 5 refills | Status: DC

## 2023-06-24 NOTE — Patient Instructions (Addendum)
Allergic Rhinitis: Vernal Keratoconjunctivitis: - Positive skin test 04/2023: trees, grasses, weeds, molds, dust mite, cats, dogs  - Avoidance measures discussed. - Use nasal saline rinses before nose sprays such as with Neilmed Sinus Rinse.  Use distilled water.   - Use Flonase 2 sprays each nostril daily. Aim upward and outward.  Can hold for 2-3 days if you have nosebleeds/dryness.  - Use Azelastine 1-2 sprays each nostril twice daily as needed for runny nose, drainage, sneezing, congestion. Aim upward and outward. - Use Zyrtec 10 mg daily.  - Use Singulair 10mg  daily. Stop if there are any mood/behavioral changes. - Follow up with Ophthalmology closely for eye drop management: currently on Cromolyn, Verkazia.  Off steroid drops.  - Consider allergy shots as long term control of your symptoms by teaching your immune system to be more tolerant of your allergy triggers.  This will improve both your sinus and eye symptoms.  Please call us back and let us know if you are interested in starting these.  Mild Persistent Asthma: - Spirometry today was normal.  - Maintenance inhaler: continue Flovent 2 puffs daily.  If symptoms worsen, increase to 2 puffs twice daily.   - Rescue inhaler: Albuterol 2 puffs via spacer or 1 vial via nebulizer every 4-6 hours as needed for respiratory symptoms of cough, shortness of breath, or wheezing Asthma control goals:  Full participation in all desired activities (may need albuterol before activity) Albuterol use two times or less a week on average (not counting use with activity) Cough interfering with sleep two times or less a month Oral steroids no more than once a year No hospitalizations

## 2023-06-24 NOTE — Progress Notes (Signed)
FOLLOW UP Date of Service/Encounter:  06/24/23   Subjective:  Thomas Parsons (DOB: 07/25/1996) is a 27 y.o. male [facility] for follow up for allergic rhinitis, asthma and vernal keratoconjunctivitis.   History obtained from: chart review and patient. Last visit was recovering from a flare up of VKC requiring topical steroid eye drops in addition to cromolyn and [place].  Multiple sensitivities identified on SPT so discussed AIT.  Also started on Flonase, PRN Azelastine, Zyrtec, Singulair.   Asthma: Asthma Control Test: ACT Total Score: 21.   Reports asthma has done well overall.  Did run out of Flovent for 2-3 days once and noted to have increased chest tightness requiring albuterol.  Otherwise no ER visits/oral prednisone use/nighttime awakenings.  Taking Singulair and Flovent daily.   Allergies: Reports doing better.  Has not had as much congestion, drainage, runny nose as previously but still present.  Also noted some nose bleeds.  Using Flonase, Zyrtec, Singulair daily; rarely Azelastine.   Due to see eye doctor in October; off steroid eye drops.  Still on Verkazia and Cromolyn. Has not had a flareup of VKC since last visit.  He is interested in AIT but has a high deductible.  He is thinking of saving up and starting shots next year.   Past Medical History: Past Medical History:  Diagnosis Date   Asthma     Objective:  BP 116/78 (BP Location: Left Arm, Patient Position: Sitting, Cuff Size: Normal)   Pulse 70   Temp 97.6 F (36.4 C) (Temporal)   Resp 16   Ht 5\' 5"  (1.651 m)   Wt 143 lb 1.6 oz (64.9 kg)   SpO2 98%   BMI 23.81 kg/m  Body mass index is 23.81 kg/m. Physical Exam: GEN: alert, well developed HEENT: clear conjunctiva, TM grey and translucent, nose with moderate inferior turbinate hypertrophy, pink nasal mucosa, clear rhinorrhea with some dried blood in R nostril but no scabs/sores, no  cobblestoning HEART: regular rate and rhythm, no murmur LUNGS: clear to auscultation bilaterally, no coughing, unlabored respiration SKIN: no rashes or lesions  Spirometry:  Tracings reviewed. His effort: Good reproducible efforts. FVC: 4.31L FEV1: 3.83L, 99% predicted FEV1/FVC ratio: 89% Interpretation: Spirometry consistent with normal pattern.  Please see scanned spirometry results for details.  Assessment:   1. Mild persistent asthma without complication   2. Seasonal and perennial allergic rhinitis   3. Vernal keratoconjunctivitis of both eyes     Plan/Recommendations:  Allergic Rhinitis: Vernal Keratoconjunctivitis: - Improved rhinitis symptoms.  Now off topical steroids for VKC but has had multiple flare ups requiring them.  Discussed AIT, will consider based on insurance as he has a high deductible.  - Positive skin test 04/2023: trees, grasses, weeds, molds, dust mite, cats, dogs  - Avoidance measures discussed. - Use nasal saline rinses before nose sprays such as with Neilmed Sinus Rinse.  Use distilled water.   - Use Flonase 2 sprays each nostril daily. Aim upward and outward.  Can hold for 2-3 days if you have nosebleeds/dryness.  - Use Azelastine 1-2 sprays each nostril twice daily as needed for runny nose, drainage, sneezing, congestion. Aim upward and outward. - Use Zyrtec 10 mg daily.  - Use Singulair 10mg  daily. Stop if there are any mood/behavioral changes. - Follow up with Ophthalmology closely for eye drop management: currently on Cromolyn, Verkazia.  Off steroid drops.  - Consider allergy shots as long term control of your symptoms  by teaching your immune system to be more tolerant of your allergy triggers.  This will improve both your sinus and eye symptoms.  Please call us back and let us know if you are interested in starting these.  Mild Persistent Asthma: - Well controlled. Spirometry today was normal.  - Maintenance inhaler: continue Flovent 2  puffs daily.  If symptoms worsen, increase to 2 puffs twice daily.   - Rescue inhaler: Albuterol 2 puffs via spacer or 1 vial via nebulizer every 4-6 hours as needed for respiratory symptoms of cough, shortness of breath, or wheezing Asthma control goals:  Full participation in all desired activities (may need albuterol before activity) Albuterol use two times or less a week on average (not counting use with activity) Cough interfering with sleep two times or less a month Oral steroids no more than once a year No hospitalizations    Return in about 4 months (around 10/24/2023).  Alesia Morin, MD [facility]

## 2023-06-30 ENCOUNTER — Other Ambulatory Visit (HOSPITAL_COMMUNITY): Payer: Self-pay

## 2023-07-01 ENCOUNTER — Telehealth: Payer: Self-pay

## 2023-07-01 ENCOUNTER — Other Ambulatory Visit (HOSPITAL_COMMUNITY): Payer: Self-pay

## 2023-07-01 NOTE — Telephone Encounter (Signed)
Would you be ok with the patient being on Pulmicort, or would you like to proceed with the PA for Fluticasone?

## 2023-07-01 NOTE — Telephone Encounter (Signed)
Pharmacy Patient Advocate Encounter   Received notification from CoverMyMeds that prior authorization for Fluticasone Propionate HFA 110MCG/ACT aerosol is required/requested.   Insurance verification completed.   The patient is insured through [facility] .   Prior Authorization form/request asks a question that requires your assistance. Please see the question below and advise accordingly.   The patient's drug benefit plan provides coverage for other drugs which may be considered for treating your patient. Can your patient be treated with a formulary drug? Available Formulary Alternative: Pulmicort Flexhaler [NOTE: If yes, provide your patient with a new prescription for the formulary product.]

## 2023-07-04 ENCOUNTER — Other Ambulatory Visit: Payer: Self-pay | Admitting: Internal Medicine

## 2023-07-04 MED ORDER — PULMICORT FLEXHALER 90 MCG/ACT IN AEPB: 1.0000 | INHALATION_SPRAY | Freq: Two times a day (BID) | RESPIRATORY_TRACT | 5 refills | Status: DC

## 2023-07-04 NOTE — Progress Notes (Signed)
Flovent no longer covered.  Will try Pulmicort Flexhaler which is.

## 2023-07-05 ENCOUNTER — Other Ambulatory Visit (HOSPITAL_COMMUNITY): Payer: Self-pay

## 2023-07-05 NOTE — Telephone Encounter (Signed)
Ran test claim and shows $40.00 co-pay. Called pharmacy and left voicemail (as they were closed) with my call back number if they have any issues.

## 2023-07-08 ENCOUNTER — Other Ambulatory Visit (HOSPITAL_COMMUNITY): Payer: Self-pay

## 2023-07-08 NOTE — Telephone Encounter (Signed)
Pulmicort Flexhaler was filled on 07-05-2023 at [facility] with a $40.00 co-pay

## 2023-08-15 ENCOUNTER — Telehealth: Payer: Self-pay | Admitting: Internal Medicine

## 2023-08-15 NOTE — Telephone Encounter (Signed)
Patient called stating [facility] [address] Closed [facility]  [id number] [place] medications include albuterol (VENTOLIN HFA) 108 (90 Base) MCG/ACT inhaler [[id number]], fluticasone (FLONASE) 50 MCG/ACT nasal spray [[id number]] and montelukast (SINGULAIR) 10 MG tablet [[id number]] and PULMICORT FLEXHALER 90 MCG/ACT inhaler [[id number]] please advise

## 2023-08-17 NOTE — Telephone Encounter (Signed)
[facility] [name] and they stated all he has to do is call them and they can just fill them from the [address] location. I called and left a message for [name] letting him know.  Thomas Parsons [phone]

## 2023-10-25 ENCOUNTER — Ambulatory Visit: Payer: No Typology Code available for payment source | Admitting: Internal Medicine

## 2023-10-25 ENCOUNTER — Other Ambulatory Visit: Payer: Self-pay

## 2023-10-25 ENCOUNTER — Encounter: Payer: Self-pay | Admitting: Internal Medicine

## 2023-10-25 VITALS — BP 108/76 | HR 64 | Temp 98.6°F | Wt 149.6 lb

## 2023-10-25 DIAGNOSIS — J3089 Other allergic rhinitis: Secondary | ICD-10-CM

## 2023-10-25 DIAGNOSIS — H16263 Vernal keratoconjunctivitis, with limbar and corneal involvement, bilateral: Secondary | ICD-10-CM

## 2023-10-25 DIAGNOSIS — J302 Other seasonal allergic rhinitis: Secondary | ICD-10-CM | POA: Diagnosis not present

## 2023-10-25 DIAGNOSIS — J4530 Mild persistent asthma, uncomplicated: Secondary | ICD-10-CM

## 2023-10-25 MED ORDER — PULMICORT FLEXHALER 90 MCG/ACT IN AEPB: 1.0000 | INHALATION_SPRAY | Freq: Two times a day (BID) | RESPIRATORY_TRACT | 5 refills | Status: DC

## 2023-10-25 MED ORDER — CROMOLYN SODIUM 4 % OP SOLN: 1.0000 [drp] | Freq: Four times a day (QID) | OPHTHALMIC | 5 refills | Status: DC | PRN

## 2023-10-25 MED ORDER — MONTELUKAST SODIUM 10 MG PO TABS: 10.0000 mg | ORAL_TABLET | Freq: Every day | ORAL | 5 refills | Status: DC

## 2023-10-25 MED ORDER — CETIRIZINE HCL 10 MG PO TABS: 10.0000 mg | ORAL_TABLET | Freq: Every day | ORAL | 5 refills | Status: AC

## 2023-10-25 MED ORDER — AZELASTINE HCL 0.1 % NA SOLN: 1.0000 | Freq: Two times a day (BID) | NASAL | 5 refills | Status: DC | PRN

## 2023-10-25 MED ORDER — FLUTICASONE PROPIONATE 50 MCG/ACT NA SUSP: 2.0000 | Freq: Every day | NASAL | 5 refills | Status: DC

## 2023-10-25 MED ORDER — ALBUTEROL SULFATE HFA 108 (90 BASE) MCG/ACT IN AERS: 2.0000 | INHALATION_SPRAY | Freq: Four times a day (QID) | RESPIRATORY_TRACT | 1 refills | Status: DC | PRN

## 2023-10-25 NOTE — Progress Notes (Signed)
 FOLLOW UP Date of Service/Encounter:  10/25/23   Subjective:  Thomas Parsons (DOB: 1995/11/23) is a 28 y.o. male who [facility] for follow up for allergic rhinitis, asthma, VKC.  History obtained from: chart review and patient. Last visit on 06/24/2023 with me and at the time asthma was doing well on Singulair /Flonase . In terms of allergies/VKC, on Flonase , Zyrtec , Singulair  and rarely Azelastine .  Followed by Jeraline on Verkazia and Cromolyn .  Have discussed AIT due to his VKC.   Asthma Control Test: ACT Total Score: 21.    Since last visit, reports asthma has done fairly well.  He did have 1 or 2 episodes where he had to use his albuterol  due to dyspnea.  Had to switch to Pulmicort  due to insurance so still getting the hang of technique.  No ER visits/oral prednisone use.   Allergies are fine also and generally do well in Fall/Winter.  Not much issues with eyes either.  Did miss last Optho appointment so he will call back to reschedule- still on Verkazia and Cromolyn .  Also on Zyrtec , Singulair  and Flonase  every other day (due to dryness).  Rarely needs Azelastine .   Past Medical History: Past Medical History:  Diagnosis Date   Asthma     Objective:  BP 108/76 (BP Location: Left Arm, Patient Position: Sitting, Cuff Size: Normal)   Pulse 64   Temp 98.6 F (37 C) (Temporal)   Wt 149 lb 9.6 oz (67.9 kg)   SpO2 99%   BMI 24.89 kg/m  Body mass index is 24.89 kg/m. Physical Exam: GEN: alert, well developed HEENT: clear conjunctiva, nose with mild inferior turbinate hypertrophy, pink nasal mucosa, slight clear rhinorrhea, no cobblestoning HEART: regular rate and rhythm, no murmur LUNGS: clear to auscultation bilaterally, no coughing, unlabored respiration SKIN: no rashes or lesions  Spirometry:  Tracings reviewed. His effort: Good reproducible efforts. FVC: 4.53L, 99% predicted  FEV1: 3.76L, 98% predicted FEV1/FVC  ratio: 83% Interpretation: Spirometry consistent with normal pattern.  Please see scanned spirometry results for details.  Assessment:   1. Seasonal and perennial allergic rhinitis   2. Vernal keratoconjunctivitis of both eyes   3. Mild persistent asthma without complication     Plan/Recommendations:  Allergic Rhinitis: Vernal Keratoconjunctivitis: - Controlled, I will be curious to see how he does in Spring/Summer though.  Discussed to make follow up with Optho also.  - Positive skin test 04/2023: trees, grasses, weeds, molds, dust mite, cats, dogs  - Avoidance measures discussed. - Use nasal saline rinses before nose sprays such as with Neilmed Sinus Rinse.  Use distilled water.   - Use Flonase  2 sprays each nostril daily. Aim upward and outward.  Can hold for 2-3 days if you have nosebleeds/dryness.  - Use Azelastine  1-2 sprays each nostril twice daily as needed for runny nose, drainage, sneezing, congestion. Aim upward and outward. - Use Zyrtec  10 mg daily.  - Use Singulair  10mg  daily. Stop if there are any mood/behavioral changes. - Follow up with Ophthalmology closely for eye drop management: currently on Cromolyn , Verkazia.  Off steroid drops.  - Consider allergy  shots as long term control of your symptoms by teaching your immune system to be more tolerant of your allergy  triggers.  This will improve both your sinus and eye symptoms.  Please call us  back and let us  know if you are interested in starting these.  Mild Persistent Asthma: - Spirometry today was normal. MDI technique discussed. Controlled.  -  Maintenance inhaler: continue Pulmicort  90mcg 1 puff twice daily.  If symptoms worsen, increase to 2 puffs twice daily.  - Rescue inhaler: Albuterol  2 puffs via spacer or 1 vial via nebulizer every 4-6 hours as needed for respiratory symptoms of cough, shortness of breath, or wheezing Asthma control goals:  Full participation in all desired activities (may need albuterol  before  activity) Albuterol  use two times or less a week on average (not counting use with activity) Cough interfering with sleep two times or less a month Oral steroids no more than once a year No hospitalizations    Return in about 6 months (around 04/23/2024).  Arleta Blanch, MD [facility] 

## 2023-10-25 NOTE — Patient Instructions (Addendum)
 Allergic Rhinitis: Vernal Keratoconjunctivitis: - Positive skin test 04/2023: trees, grasses, weeds, molds, dust mite, cats, dogs  - Avoidance measures discussed. - Use nasal saline rinses before nose sprays such as with Neilmed Sinus Rinse.  Use distilled water.   - Use Flonase  2 sprays each nostril daily. Aim upward and outward.  Can hold for 2-3 days if you have nosebleeds/dryness.  - Use Azelastine  1-2 sprays each nostril twice daily as needed for runny nose, drainage, sneezing, congestion. Aim upward and outward. - Use Zyrtec  10 mg daily.  - Use Singulair  10mg  daily. Stop if there are any mood/behavioral changes. - Follow up with Ophthalmology closely for eye drop management: currently on Cromolyn , Verkazia.  Off steroid drops.  - Consider allergy  shots as long term control of your symptoms by teaching your immune system to be more tolerant of your allergy  triggers.  This will improve both your sinus and eye symptoms.  Please call us  back and let us  know if you are interested in starting these.  Mild Persistent Asthma: - Spirometry today was normal.  - Maintenance inhaler: continue Pulmicort  90mcg 1 puff twice daily.  If symptoms worsen, increase to 2 puffs twice daily.  - Rescue inhaler: Albuterol  2 puffs via spacer or 1 vial via nebulizer every 4-6 hours as needed for respiratory symptoms of cough, shortness of breath, or wheezing Asthma control goals:  Full participation in all desired activities (may need albuterol  before activity) Albuterol  use two times or less a week on average (not counting use with activity) Cough interfering with sleep two times or less a month Oral steroids no more than once a year No hospitalizations

## 2024-05-12 ENCOUNTER — Other Ambulatory Visit: Payer: Self-pay | Admitting: Internal Medicine

## 2024-05-27 ENCOUNTER — Other Ambulatory Visit: Payer: Self-pay | Admitting: Internal Medicine

## 2024-05-29 ENCOUNTER — Ambulatory Visit: Payer: No Typology Code available for payment source | Admitting: Internal Medicine

## 2024-05-29 ENCOUNTER — Encounter: Payer: Self-pay | Admitting: Internal Medicine

## 2024-05-29 ENCOUNTER — Other Ambulatory Visit: Payer: Self-pay

## 2024-05-29 DIAGNOSIS — J3089 Other allergic rhinitis: Secondary | ICD-10-CM | POA: Diagnosis not present

## 2024-05-29 DIAGNOSIS — J4530 Mild persistent asthma, uncomplicated: Secondary | ICD-10-CM | POA: Diagnosis not present

## 2024-05-29 DIAGNOSIS — J302 Other seasonal allergic rhinitis: Secondary | ICD-10-CM | POA: Diagnosis not present

## 2024-05-29 DIAGNOSIS — H16263 Vernal keratoconjunctivitis, with limbar and corneal involvement, bilateral: Secondary | ICD-10-CM | POA: Diagnosis not present

## 2024-05-29 MED ORDER — CROMOLYN SODIUM 4 % OP SOLN: 1.0000 [drp] | Freq: Four times a day (QID) | OPHTHALMIC | 5 refills | Status: AC | PRN

## 2024-05-29 MED ORDER — AZELASTINE HCL 0.1 % NA SOLN: 1.0000 | Freq: Two times a day (BID) | NASAL | 5 refills | Status: AC | PRN

## 2024-05-29 MED ORDER — ALBUTEROL SULFATE HFA 108 (90 BASE) MCG/ACT IN AERS: 2.0000 | INHALATION_SPRAY | Freq: Four times a day (QID) | RESPIRATORY_TRACT | 1 refills | Status: AC | PRN

## 2024-05-29 MED ORDER — MONTELUKAST SODIUM 10 MG PO TABS: 10.0000 mg | ORAL_TABLET | Freq: Every day | ORAL | 1 refills | Status: AC

## 2024-05-29 MED ORDER — FLUTICASONE PROPIONATE 50 MCG/ACT NA SUSP: 2.0000 | Freq: Every day | NASAL | 1 refills | Status: AC

## 2024-05-29 MED ORDER — PULMICORT FLEXHALER 90 MCG/ACT IN AEPB: 1.0000 | INHALATION_SPRAY | Freq: Two times a day (BID) | RESPIRATORY_TRACT | 5 refills | Status: AC

## 2024-05-29 NOTE — Patient Instructions (Addendum)
 Allergic Rhinitis: Vernal Keratoconjunctivitis: - Positive skin test 04/2023: trees, grasses, weeds, molds, dust mite, cats, dogs  - Use nasal saline rinses before nose sprays such as with Neilmed Sinus Rinse.  Use distilled water.   - Use Flonase  2 sprays each nostril daily. Aim upward and outward.  - Use Azelastine  1-2 sprays each nostril twice daily as needed for runny nose, drainage, sneezing, congestion. Aim upward and outward. - Use Zyrtec  10 mg daily.  - Use Singulair  10mg  daily. Stop if there are any mood/behavioral changes. - Follow up with Ophthalmology closely for eye drop management: currently on Cromolyn , Verkazia.  On PRN steroid eye drops.  - Consider allergy  shots as long term control of your symptoms by teaching your immune system to be more tolerant of your allergy  triggers.  This will improve both your sinus and eye symptoms.  Please call us  back and let us  know if you are interested in starting these.  Mild Persistent Asthma: - Maintenance inhaler: continue Pulmicort  90mcg 2 puffs once daily and continue Singulair  10mg  daily.   - Rescue inhaler: Albuterol  2 puffs via spacer or 1 vial via nebulizer every 4-6 hours as needed for respiratory symptoms of cough, shortness of breath, or wheezing Asthma control goals:  Full participation in all desired activities (may need albuterol  before activity) Albuterol  use two times or less a week on average (not counting use with activity) Cough interfering with sleep two times or less a month Oral steroids no more than once a year No hospitalizations

## 2024-05-29 NOTE — Progress Notes (Signed)
 FOLLOW UP Date of Service/Encounter:  05/29/24   Subjective:  Thomas Parsons (DOB: 1996-01-13) is a 28 y.o. male who [facility] for follow up for allergic rhinitis, asthma, VKC   History obtained from: chart review and patient. Last seen on 10/25/2023: VKC: doing fine, seen by Optho on Verkazia/Cromolyn  eye drops and off steroids AR- Zyrtec , Singulair , Flonase  and PRN Azelastine  Asthma- doing well on Pulmicort   Since last visit, reports asthma is doing well.  He did run out of his Pulmicort  for a short time and did notice increased shortness of breath requiring albuterol .  Outside of this maybe has had 2-3 episodes where he needed albuterol  for dyspnea since last visit.  No ER visits, or prednisone use.  Using Pulmicort  2 puffs once a day.  Allergies are up and down.  Worse during July especially with his eyes.  Does note some increased drainage and runny nose and irritation/itching of his eyes.  Using cromolyn , verkazia, PRN steroid eye drops; seeing Optho in a week.     Past Medical History: Past Medical History:  Diagnosis Date   Asthma     Objective:  There were no vitals taken for this visit. There is no height or weight on file to calculate BMI. Physical Exam: GEN: alert, well developed HEENT: slight erythema of bl conjunctiva, nose with mild inferior turbinate hypertrophy, pink nasal mucosa, + clear rhinorrhea, + cobblestoning HEART: regular rate and rhythm, no murmur LUNGS: clear to auscultation bilaterally, no coughing, unlabored respiration SKIN: no rashes or lesions  Spirometry:  Tracings reviewed. His effort: Good reproducible efforts. FVC: 4.09 L, 89% predicted  FEV1: 3.48 L, 90% predicted FEV1/FVC ratio: 85% Interpretation: Spirometry consistent with normal pattern.  Please see scanned spirometry results for details.  Assessment:   1. Seasonal and perennial allergic rhinitis   2. Vernal  keratoconjunctivitis of both eyes   3. Mild persistent asthma without complication     Plan/Recommendations:  Allergic Rhinitis: Vernal Keratoconjunctivitis: - Uncontrolled, AIT is the next step but financially prohibiting especially since he had a baby.  Will consider in future.   - Positive skin test 04/2023: trees, grasses, weeds, molds, dust mite, cats, dogs  - Use nasal saline rinses before nose sprays such as with Neilmed Sinus Rinse.  Use distilled water.   - Use Flonase  2 sprays each nostril daily. Aim upward and outward.  - Use Azelastine  1-2 sprays each nostril twice daily as needed for runny nose, drainage, sneezing, congestion. Aim upward and outward. - Use Zyrtec  10 mg daily.  - Use Singulair  10mg  daily. Stop if there are any mood/behavioral changes. - Follow up with Ophthalmology closely for eye drop management: currently on Cromolyn , Verkazia.  On PRN steroid drops.  - Consider allergy  shots as long term control of your symptoms by teaching your immune system to be more tolerant of your allergy  triggers.  This will improve both your sinus and eye symptoms.  Please call us  back and let us  know if you are interested in starting these.  Mild Persistent Asthma: - Spirometry today was normal. Well controlled.  Continue ICS.  MDI technique discussed.  - Maintenance inhaler: continue Pulmicort  90mcg 2 puffs once daily and continue Singulair  10mg  daily.   - Rescue inhaler: Albuterol  2 puffs via spacer or 1 vial via nebulizer every 4-6 hours as needed for respiratory symptoms of cough, shortness of breath, or wheezing Asthma control goals:  Full participation in all desired activities (may  need albuterol  before activity) Albuterol  use two times or less a week on average (not counting use with activity) Cough interfering with sleep two times or less a month Oral steroids no more than once a year No hospitalizations    Return in about 6 months (around 11/29/2024).  Arleta Blanch,  MD [facility] [place] 

## 2024-06-26 ENCOUNTER — Ambulatory Visit (HOSPITAL_BASED_OUTPATIENT_CLINIC_OR_DEPARTMENT_OTHER): Admitting: Student

## 2024-06-26 ENCOUNTER — Ambulatory Visit (HOSPITAL_BASED_OUTPATIENT_CLINIC_OR_DEPARTMENT_OTHER)

## 2024-06-26 DIAGNOSIS — M79671 Pain in right foot: Secondary | ICD-10-CM | POA: Diagnosis not present

## 2024-06-26 DIAGNOSIS — S92351A Displaced fracture of fifth metatarsal bone, right foot, initial encounter for closed fracture: Secondary | ICD-10-CM

## 2024-06-26 NOTE — Progress Notes (Signed)
 Chief Complaint: Right foot pain     History of Present Illness:    Thomas Parsons is a 28 y.o. male who presents today for evaluation of pain in his right foot.  The injury occurred 3 days ago while at a wedding in [place].  Patient was dancing and stepped off of the edge of the dance floor causing his foot to twist inward.  He developed immediate pain over the lateral foot particular with weightbearing.  He has developed swelling and bruising as well in the foot.  Pain is mild to moderate with use of ibuprofen .  Denies any prior injury to the foot.   Surgical History:   None  PMH/PSH/Family History/Social History/Meds/Allergies:    Past Medical History:  Diagnosis Date   Asthma    No past surgical history on file. Social History   Socioeconomic History   Marital status: Married    Spouse name: Not on file   Number of children: Not on file   Years of education: Not on file   Highest education level: Not on file  Occupational History   Not on file  Tobacco Use   Smoking status: Never   Smokeless tobacco: Never  Vaping Use   Vaping status: Never Used  Substance and Sexual Activity   Alcohol use: Yes   Drug use: No   Sexual activity: Yes  Other Topics Concern   Not on file  Social History Narrative   Not on file   Social Drivers of Health   Financial Resource Strain: Not on file  Food Insecurity: Not on file  Transportation Needs: Not on file  Physical Activity: Not on file  Stress: Not on file  Social Connections: Not on file   Family History  Problem Relation Age of Onset   Asthma Mother    Allergic rhinitis Mother    Allergic rhinitis Father    Allergic rhinitis Maternal Aunt    No Known Allergies Current Outpatient Medications  Medication Sig Dispense Refill   albuterol  (VENTOLIN  HFA) 108 (90 Base) MCG/ACT inhaler Inhale 2 puffs into the lungs every 6 (six) hours as needed. 18 g 1   azelastine   (ASTELIN ) 0.1 % nasal spray Place 1 spray into both nostrils 2 (two) times daily as needed. Use in each nostril as directed 30 mL 5   Budesonide  (PULMICORT  FLEXHALER) 90 MCG/ACT inhaler Inhale 1 puff into the lungs 2 (two) times daily. 1 each 5   cetirizine  (ZYRTEC  ALLERGY ) 10 MG tablet Take 1 tablet (10 mg total) by mouth daily. 30 tablet 5   clobetasol (TEMOVATE) 0.05 % external solution Apply 1 Application topically 2 (two) times daily.     cromolyn  (OPTICROM ) 4 % ophthalmic solution Place 1 drop into both eyes 4 (four) times daily as needed. 10 mL 5   fluticasone  (CUTIVATE ) 0.05 % cream Apply 1 Application topically daily.     fluticasone  (FLONASE ) 50 MCG/ACT nasal spray Place 2 sprays into both nostrils daily. 48 mL 1   montelukast  (SINGULAIR ) 10 MG tablet Take 1 tablet (10 mg total) by mouth at bedtime. 90 tablet 1   VERKAZIA 0.1 % EMUL Place 1 drop into the right eye 2 (two) times daily.     No current facility-administered medications for this visit.   No results found.  Review of Systems:  A ROS was performed including pertinent positives and negatives as documented in the HPI.  Physical Exam :   Constitutional: NAD and appears stated age Neurological: Alert and oriented Psych: Appropriate affect and cooperative There were no vitals taken for this visit.   Comprehensive Musculoskeletal Exam:    Exam of the right foot demonstrates diffuse ecchymosis throughout the foot which is focal over the proximal fifth metatarsal.  Tenderness over the fifth metatarsal base.  Dorsalis pedis pulse 2+.  With brisk capillary refill all 5 digits.  No tenderness over the lateral malleolus or lateral ankle ligaments.  Distal neurosensory exam intact.  Imaging:   Xray (right foot 3 views): Minimally displaced fifth metatarsal base fracture   I personally reviewed and interpreted the radiographs.   Assessment:   28 y.o. male with acute right foot pain after what is described to be an  inversion injury 3 days ago.  X-rays today demonstrate presence of a minimally displaced fracture at the tip of the fifth metatarsal base.  He demonstrates no tenderness or swelling at the ankle with full range of motion.  Discussed I would like to immobilize this in a postop shoe and will plan to follow-up with repeat x-rays in 4 weeks to assess healing.  Recommend not to drive while wearing this as it is his right foot.  Continue ibuprofen  and Tylenol as needed.  Plan :    - Return to clinic in 4 weeks for repeat x-rays and reassessment     I personally saw and evaluated the patient, and participated in the management and treatment plan.  Leonce Reveal, PA-C Orthopedics

## 2024-07-02 ENCOUNTER — Telehealth: Payer: Self-pay

## 2024-07-02 NOTE — Telephone Encounter (Signed)
 Radiology called wanting to let you know that patient has a right foot Fx.  Thank you.

## 2024-07-23 ENCOUNTER — Ambulatory Visit (INDEPENDENT_AMBULATORY_CARE_PROVIDER_SITE_OTHER): Admitting: Student

## 2024-07-23 ENCOUNTER — Ambulatory Visit (INDEPENDENT_AMBULATORY_CARE_PROVIDER_SITE_OTHER)

## 2024-07-23 DIAGNOSIS — S92351A Displaced fracture of fifth metatarsal bone, right foot, initial encounter for closed fracture: Secondary | ICD-10-CM

## 2024-07-23 NOTE — Progress Notes (Signed)
 Chief Complaint: Right foot pain     History of Present Illness:   07/23/24: Patient presents to clinic today for 4-week follow-up of a right fifth metatarsal base fracture.  He states that the foot is feeling better and only describes minimal pain without use pain medication.  He is tolerating the postop shoe well and is using it consistently.   06/26/24: Thomas Parsons is a 28 y.o. male who presents today for evaluation of pain in his right foot.  The injury occurred 3 days ago while at a wedding in [place].  Patient was dancing and stepped off of the edge of the dance floor causing his foot to twist inward.  He developed immediate pain over the lateral foot particular with weightbearing.  He has developed swelling and bruising as well in the foot.  Pain is mild to moderate with use of ibuprofen .  Denies any prior injury to the foot.   Surgical History:   None  PMH/PSH/Family History/Social History/Meds/Allergies:    Past Medical History:  Diagnosis Date   Asthma    No past surgical history on file. Social History   Socioeconomic History   Marital status: Married    Spouse name: Not on file   Number of children: Not on file   Years of education: Not on file   Highest education level: Not on file  Occupational History   Not on file  Tobacco Use   Smoking status: Never   Smokeless tobacco: Never  Vaping Use   Vaping status: Never Used  Substance and Sexual Activity   Alcohol use: Yes   Drug use: No   Sexual activity: Yes  Other Topics Concern   Not on file  Social History Narrative   Not on file   Social Drivers of Health   Financial Resource Strain: Not on file  Food Insecurity: Not on file  Transportation Needs: Not on file  Physical Activity: Not on file  Stress: Not on file  Social Connections: Not on file   Family History  Problem Relation Age of Onset   Asthma Mother    Allergic rhinitis Mother     Allergic rhinitis Father    Allergic rhinitis Maternal Aunt    No Known Allergies Current Outpatient Medications  Medication Sig Dispense Refill   albuterol  (VENTOLIN  HFA) 108 (90 Base) MCG/ACT inhaler Inhale 2 puffs into the lungs every 6 (six) hours as needed. 18 g 1   azelastine  (ASTELIN ) 0.1 % nasal spray Place 1 spray into both nostrils 2 (two) times daily as needed. Use in each nostril as directed 30 mL 5   Budesonide  (PULMICORT  FLEXHALER) 90 MCG/ACT inhaler Inhale 1 puff into the lungs 2 (two) times daily. 1 each 5   cetirizine  (ZYRTEC  ALLERGY ) 10 MG tablet Take 1 tablet (10 mg total) by mouth daily. 30 tablet 5   clobetasol (TEMOVATE) 0.05 % external solution Apply 1 Application topically 2 (two) times daily.     cromolyn  (OPTICROM ) 4 % ophthalmic solution Place 1 drop into both eyes 4 (four) times daily as needed. 10 mL 5   fluticasone  (CUTIVATE ) 0.05 % cream Apply 1 Application topically daily.     fluticasone  (FLONASE ) 50 MCG/ACT nasal spray Place 2 sprays into both nostrils daily. 48 mL 1   montelukast  (SINGULAIR ) 10 MG tablet Take  1 tablet (10 mg total) by mouth at bedtime. 90 tablet 1   VERKAZIA 0.1 % EMUL Place 1 drop into the right eye 2 (two) times daily.     No current facility-administered medications for this visit.   No results found.  Review of Systems:   A ROS was performed including pertinent positives and negatives as documented in the HPI.  Physical Exam :   Constitutional: NAD and appears stated age Neurological: Alert and oriented Psych: Appropriate affect and cooperative There were no vitals taken for this visit.   Comprehensive Musculoskeletal Exam:    Right foot exam appears all within normal limits with no tenderness palpation over the fifth metatarsal base.  Swelling and ecchymosis throughout the foot has now resolved.  Full active range of motion of the ankle with dorsiflexion and plantarflexion.  DP pulse 2+.  Imaging:   Xray (right foot 3  views): Continued visualization of minimally displaced fifth metatarsal base fracture with early bridging callus formation   I personally reviewed and interpreted the radiographs.   Assessment:   28 y.o. male 4 weeks status post fifth metatarsal base fracture of the right foot.  He has been immobilized in a postop shoe and is tolerating this well his symptoms have drastically improved.  X-rays of the foot today show continued visualization of the fracture with only very early stage signs of healing.  Discussed with patient that I would like to continue immobilization for another few weeks in order to encourage continued healing and lessen the risk of reinjury or nonunion.  Will plan to have him continue use of postop shoe for another 3 weeks, at which time I would like to have him follow back up for repeat x-rays and may be able to discuss transition into rigid insoles in normal shoes.   Plan :    - Return to clinic in 3 weeks for repeat x-rays and reassessment     I personally saw and evaluated the patient, and participated in the management and treatment plan.  Leonce Reveal, PA-C Orthopedics

## 2024-08-13 ENCOUNTER — Ambulatory Visit (HOSPITAL_BASED_OUTPATIENT_CLINIC_OR_DEPARTMENT_OTHER): Admitting: Student

## 2024-08-13 ENCOUNTER — Ambulatory Visit (INDEPENDENT_AMBULATORY_CARE_PROVIDER_SITE_OTHER)

## 2024-08-13 DIAGNOSIS — M79671 Pain in right foot: Secondary | ICD-10-CM | POA: Diagnosis not present

## 2024-08-13 DIAGNOSIS — S92351D Displaced fracture of fifth metatarsal bone, right foot, subsequent encounter for fracture with routine healing: Secondary | ICD-10-CM

## 2024-08-13 NOTE — Progress Notes (Signed)
 Chief Complaint: Right foot pain     History of Present Illness:   08/13/24: Patient presents today for follow-up approximately 7 weeks status post fracture to the right fifth metatarsal base.  He has been continuing use of the postop shoe which he is tolerating well.  He denies any pain at baseline but does experience some soreness after longer periods of activity.  Denies taking any pain medication.   07/23/24: Patient presents to clinic today for 4-week follow-up of a right fifth metatarsal base fracture.  He states that the foot is feeling better and only describes minimal pain without use pain medication.  He is tolerating the postop shoe well and is using it consistently.   Surgical History:   None  PMH/PSH/Family History/Social History/Meds/Allergies:    Past Medical History:  Diagnosis Date   Asthma    No past surgical history on file. Social History   Socioeconomic History   Marital status: Married    Spouse name: Not on file   Number of children: Not on file   Years of education: Not on file   Highest education level: Not on file  Occupational History   Not on file  Tobacco Use   Smoking status: Never   Smokeless tobacco: Never  Vaping Use   Vaping status: Never Used  Substance and Sexual Activity   Alcohol use: Yes   Drug use: No   Sexual activity: Yes  Other Topics Concern   Not on file  Social History Narrative   Not on file   Social Drivers of Health   Financial Resource Strain: Not on file  Food Insecurity: Not on file  Transportation Needs: Not on file  Physical Activity: Not on file  Stress: Not on file  Social Connections: Not on file   Family History  Problem Relation Age of Onset   Asthma Mother    Allergic rhinitis Mother    Allergic rhinitis Father    Allergic rhinitis Maternal Aunt    No Known Allergies Current Outpatient Medications  Medication Sig Dispense Refill   albuterol  (VENTOLIN  HFA)  108 (90 Base) MCG/ACT inhaler Inhale 2 puffs into the lungs every 6 (six) hours as needed. 18 g 1   azelastine  (ASTELIN ) 0.1 % nasal spray Place 1 spray into both nostrils 2 (two) times daily as needed. Use in each nostril as directed 30 mL 5   Budesonide  (PULMICORT  FLEXHALER) 90 MCG/ACT inhaler Inhale 1 puff into the lungs 2 (two) times daily. 1 each 5   cetirizine  (ZYRTEC  ALLERGY ) 10 MG tablet Take 1 tablet (10 mg total) by mouth daily. 30 tablet 5   clobetasol (TEMOVATE) 0.05 % external solution Apply 1 Application topically 2 (two) times daily.     cromolyn  (OPTICROM ) 4 % ophthalmic solution Place 1 drop into both eyes 4 (four) times daily as needed. 10 mL 5   fluticasone  (CUTIVATE ) 0.05 % cream Apply 1 Application topically daily.     fluticasone  (FLONASE ) 50 MCG/ACT nasal spray Place 2 sprays into both nostrils daily. 48 mL 1   montelukast  (SINGULAIR ) 10 MG tablet Take 1 tablet (10 mg total) by mouth at bedtime. 90 tablet 1   VERKAZIA 0.1 % EMUL Place 1 drop into the right eye 2 (two) times daily.     No current facility-administered medications for this  visit.   No results found.  Review of Systems:   A ROS was performed including pertinent positives and negatives as documented in the HPI.  Physical Exam :   Constitutional: NAD and appears stated age Neurological: Alert and oriented Psych: Appropriate affect and cooperative There were no vitals taken for this visit.   Comprehensive Musculoskeletal Exam:    No tenderness or visible deformity over the fifth metatarsal base.  Patient is ambulating in the postop shoe with normal gait.  Full active range of motion of the ankle.  DP pulse 2+.  Imaging:   Xray (right foot 3 views): Minimally displaced fifth metatarsal base fracture with good progression of bridging callus formation.   I personally reviewed and interpreted the radiographs.   Assessment:   28 y.o. male 7 weeks status post inversion injury to the right foot  resulting in a minimally displaced fifth metatarsal base fracture.  He has been weightbearing in a postop shoe for immobilization.  Today's radiographs show good progression of bony healing and fracture line appears well stabilized.  He can now begin weaning out of the postop shoe and recommend transition into more stiff soled footwear.  Also discussed use of rigid insoles if needed.  He can plan to return to activities as tolerated but would have him refrain from any high impact activity such as running for another few weeks.  Can plan to see him back as needed, but would recommend a follow-up for repeat x-rays if pain or soreness persists after another 4 weeks.   Plan :    - Return to clinic as needed     I personally saw and evaluated the patient, and participated in the management and treatment plan.  Leonce Reveal, PA-C Orthopedics

## 2024-11-28 ENCOUNTER — Ambulatory Visit: Admitting: Internal Medicine
